# Patient Record
Sex: Female | Born: 2005 | Race: White | Hispanic: No | Marital: Single | Smoking: Never smoker
Health system: Southern US, Community
[De-identification: ages and names within clinical notes are randomized; demographics above are authoritative.]

## PROBLEM LIST (undated history)

## (undated) DIAGNOSIS — T7840XA Allergy, unspecified, initial encounter: Secondary | ICD-10-CM

## (undated) DIAGNOSIS — N13721 Vesicoureteral-reflux with reflux nephropathy without hydroureter, unilateral: Secondary | ICD-10-CM

## (undated) DIAGNOSIS — D802 Selective deficiency of immunoglobulin A [IgA]: Secondary | ICD-10-CM

## (undated) DIAGNOSIS — F419 Anxiety disorder, unspecified: Secondary | ICD-10-CM

## (undated) DIAGNOSIS — M1990 Unspecified osteoarthritis, unspecified site: Secondary | ICD-10-CM

## (undated) HISTORY — DX: Anxiety disorder, unspecified: F41.9

## (undated) HISTORY — DX: Unspecified osteoarthritis, unspecified site: M19.90

## (undated) HISTORY — PX: TONSILLECTOMY: SUR1361

## (undated) HISTORY — DX: Selective deficiency of immunoglobulin a (iga): D80.2

## (undated) HISTORY — PX: ADENOIDECTOMY: SUR15

## (undated) HISTORY — DX: Allergy, unspecified, initial encounter: T78.40XA

---
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
## 2025-01-25 ENCOUNTER — Encounter: Admitting: Family Medicine
